# Patient Record
Sex: Male | Born: 1995
Health system: Southern US, Community
[De-identification: ages and names within clinical notes are randomized; demographics above are authoritative.]

## PROBLEM LIST (undated history)

## (undated) DIAGNOSIS — F419 Anxiety disorder, unspecified: Secondary | ICD-10-CM

## (undated) DIAGNOSIS — J45909 Unspecified asthma, uncomplicated: Secondary | ICD-10-CM

## (undated) DIAGNOSIS — D573 Sickle-cell trait: Secondary | ICD-10-CM

---
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
## 2016-06-04 ENCOUNTER — Ambulatory Visit (INDEPENDENT_AMBULATORY_CARE_PROVIDER_SITE_OTHER): Payer: 59 | Admitting: Physician Assistant

## 2016-06-04 ENCOUNTER — Encounter: Payer: Self-pay | Admitting: Physician Assistant

## 2016-06-04 VITALS — BP 122/72 | HR 76 | Temp 98.6°F | Ht 69.0 in | Wt 205.0 lb

## 2016-06-04 DIAGNOSIS — S01111A Laceration without foreign body of right eyelid and periocular area, initial encounter: Secondary | ICD-10-CM

## 2016-06-04 NOTE — Patient Instructions (Addendum)
WOUND CARE Please return in 7 days to have your stitches/staples removed or sooner if you have concerns. . Keep area clean and dry for 24 hours. Do not remove bandage, if applied. . After 24 hours, remove bandage and wash wound gently with mild soap and warm water. Reapply a new bandage after cleaning wound, if directed. . Continue daily cleansing with soap and water until stitches/staples are removed. . Do not apply any ointments or creams to the wound while stitches/staples are in place, as this may cause delayed healing. . Notify the office if you experience any of the following signs of infection: Swelling, redness, pus drainage, streaking, fever >101.0 F . Notify the office if you experience excessive bleeding that does not stop after 15-20 minutes of constant, firm pressure.      IF you received an x-ray today, you will receive an invoice from [facility]. Please contact [facility] at [phone] with questions or concerns regarding your invoice.   IF you received labwork today, you will receive an invoice from [facility]. Please contact [facility] at [phone] with questions or concerns regarding your invoice.   Our billing staff will not be able to assist you with questions regarding bills from these companies.  You will be contacted with the lab results as soon as they are available. The fastest way to get your results is to activate your My Chart account. Instructions are located on the last page of this paperwork. If you have not heard from us regarding the results in 2 weeks, please contact this office.      
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
## 2016-06-11 ENCOUNTER — Ambulatory Visit (INDEPENDENT_AMBULATORY_CARE_PROVIDER_SITE_OTHER): Payer: 59 | Admitting: Physician Assistant

## 2016-06-11 VITALS — BP 110/70 | HR 83 | Temp 98.3°F | Resp 18 | Ht 69.0 in | Wt 205.6 lb

## 2016-06-11 DIAGNOSIS — S01111D Laceration without foreign body of right eyelid and periocular area, subsequent encounter: Secondary | ICD-10-CM

## 2016-06-11 NOTE — Patient Instructions (Addendum)
Thank you for letting me participate in your health and well being.      IF you received an x-ray today, you will receive an invoice from [facility]. Please contact [facility] at [phone] with questions or concerns regarding your invoice.   IF you received labwork today, you will receive an invoice from [facility]. Please contact [facility] at [phone] with questions or concerns regarding your invoice.   Our billing staff will not be able to assist you with questions regarding bills from these companies.  You will be contacted with the lab results as soon as they are available. The fastest way to get your results is to activate your My Chart account. Instructions are located on the last page of this paperwork. If you have not heard from us regarding the results in 2 weeks, please contact this office.      

## 2016-06-11 NOTE — Progress Notes (Signed)
   Patient: Gerald Richmond [id number]  Subjective: Gerald Richmond is returning for suture removal. Patient was initially seen on 06/04/16 and had 5 simple interrupted sutures placed. Denies fever, drainage of pus or blood, wound dehiscence, edema, pain.   Objective: Physical Exam  Constitutional: He is oriented to person, place, and time and well-developed, well-nourished, and in no distress.  HENT:  Head: Normocephalic and atraumatic.  Eyes: Conjunctivae are normal.    Neck: Normal range of motion.  Pulmonary/Chest: Effort normal.  Neurological: He is alert and oriented to person, place, and time. Gait normal.  Skin: Skin is warm and dry.  Psychiatric: Affect normal.  Vitals reviewed.  #5 sutures removed without incident. Patient tolerated this well.  Assessment and Plan: 1. Laceration of right eyebrow, subsequent encounter Well-healed wound. Anticipatory guidance provided. Return to clinic as needed.  Benjiman CoreBrittany Jeneal Vogl, PA-C  [facility] 06/11/2016 8:44 AM

## 2016-08-19 ENCOUNTER — Ambulatory Visit (INDEPENDENT_AMBULATORY_CARE_PROVIDER_SITE_OTHER): Payer: 59 | Admitting: Urgent Care

## 2016-08-19 VITALS — BP 136/74 | HR 80 | Temp 98.2°F

## 2016-08-19 DIAGNOSIS — S0101XA Laceration without foreign body of scalp, initial encounter: Secondary | ICD-10-CM
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
## 2016-08-19 NOTE — Patient Instructions (Signed)
Laceration Care, Adult Introduction A laceration is a cut that goes through all layers of the skin. The cut also goes into the tissue that is right under the skin. Some cuts heal on their own. Others need to be closed with stitches (sutures), staples, skin adhesive strips, or wound glue. Taking care of your cut lowers your risk of infection and helps your cut to heal better. How to take care of your cut For stitches or staples:  Keep the wound clean and dry.  If you were given a bandage (dressing), you should change it at least one time per day or as told by your doctor. You should also change it if it gets wet or dirty.  Keep the wound completely dry for the first 24 hours or as told by your doctor. After that time, you may take a shower or a bath. However, make sure that the wound is not soaked in water until after the stitches or staples have been removed.  Clean the wound one time each day or as told by your doctor:  Wash the wound with soap and water.  Rinse the wound with water until all of the soap comes off.  Pat the wound dry with a clean towel. Do not rub the wound.  After you clean the wound, put a thin layer of antibiotic ointment on it as told by your doctor. This ointment:  Helps to prevent infection.  Keeps the bandage from sticking to the wound.  Have your stitches or staples removed as told by your doctor. If your doctor used skin adhesive strips:  Keep the wound clean and dry.  If you were given a bandage, you should change it at least one time per day or as told by your doctor. You should also change it if it gets dirty or wet.  Do not get the skin adhesive strips wet. You can take a shower or a bath, but be careful to keep the wound dry.  If the wound gets wet, pat it dry with a clean towel. Do not rub the wound.  Skin adhesive strips fall off on their own. You can trim the strips as the wound heals. Do not remove any strips that are still stuck to the wound.  They will fall off after a while. If your doctor used wound glue:  Try to keep your wound dry, but you may briefly wet it in the shower or bath. Do not soak the wound in water, such as by swimming.  After you take a shower or a bath, gently pat the wound dry with a clean towel. Do not rub the wound.  Do not do any activities that will make you really sweaty until the skin glue has fallen off on its own.  Do not apply liquid, cream, or ointment medicine to your wound while the skin glue is still on.  If you were given a bandage, you should change it at least one time per day or as told by your doctor. You should also change it if it gets dirty or wet.  If a bandage is placed over the wound, do not let the tape for the bandage touch the skin glue.  Do not pick at the glue. The skin glue usually stays on for 5-10 days. Then, it falls off of the skin. General Instructions  To help prevent scarring, make sure to cover your wound with sunscreen whenever you are outside after stitches are removed, after adhesive strips are removed,   or when wound glue stays in place and the wound is healed. Make sure to wear a sunscreen of at least 30 SPF.  Take over-the-counter and prescription medicines only as told by your doctor.  If you were given antibiotic medicine or ointment, take or apply it as told by your doctor. Do not stop using the antibiotic even if your wound is getting better.  Do not scratch or pick at the wound.  Keep all follow-up visits as told by your doctor. This is important.  Check your wound every day for signs of infection. Watch for:  Redness, swelling, or pain.  Fluid, blood, or pus.  Raise (elevate) the injured area above the level of your heart while you are sitting or lying down, if possible. Get help if:  You got a tetanus shot and you have any of these problems at the injection site:  Swelling.  Very bad pain.  Redness.  Bleeding.  You have a fever.  A wound  that was closed breaks open.  You notice a bad smell coming from your wound or your bandage.  You notice something coming out of the wound, such as wood or glass.  Medicine does not help your pain.  You have more redness, swelling, or pain at the site of your wound.  You have fluid, blood, or pus coming from your wound.  You notice a change in the color of your skin near your wound.  You need to change the bandage often because fluid, blood, or pus is coming from the wound.  You start to have a new rash.  You start to have numbness around the wound. Get help right away if:  You have very bad swelling around the wound.  Your pain suddenly gets worse and is very bad.  You notice painful lumps near the wound or on skin that is anywhere on your body.  You have a red streak going away from your wound.  The wound is on your hand or foot and you cannot move a finger or toe like you usually can.  The wound is on your hand or foot and you notice that your fingers or toes look pale or bluish. This information is not intended to replace advice given to you by your health care provider. Make sure you discuss any questions you have with your health care provider. Document Released: 01/20/2008 Document Revised: 01/09/2016 Document Reviewed: 07/30/2014  2017 [employer]vier  

## 2016-08-26 ENCOUNTER — Ambulatory Visit: Payer: Self-pay

## 2017-10-07 ENCOUNTER — Encounter: Payer: Self-pay | Admitting: Physician Assistant

## 2017-10-07 ENCOUNTER — Telehealth: Payer: Self-pay | Admitting: Physician Assistant

## 2017-10-07 ENCOUNTER — Ambulatory Visit (INDEPENDENT_AMBULATORY_CARE_PROVIDER_SITE_OTHER): Payer: No Typology Code available for payment source

## 2017-10-07 ENCOUNTER — Other Ambulatory Visit: Payer: Self-pay

## 2017-10-07 ENCOUNTER — Ambulatory Visit (INDEPENDENT_AMBULATORY_CARE_PROVIDER_SITE_OTHER): Payer: No Typology Code available for payment source | Admitting: Physician Assistant

## 2017-10-07 VITALS — BP 130/62 | HR 72 | Temp 98.3°F | Resp 16 | Ht 69.0 in | Wt 200.8 lb

## 2017-10-07 DIAGNOSIS — S62614A Displaced fracture of proximal phalanx of right ring finger, initial encounter for closed fracture: Secondary | ICD-10-CM | POA: Diagnosis not present

## 2017-10-07 DIAGNOSIS — M79641 Pain in right hand: Secondary | ICD-10-CM
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
## 2017-10-07 NOTE — Telephone Encounter (Signed)
Incoming call from [facility]. They are calling to notify provider patient has an appointment tomorrow at 1:45 with Dr. Amanda PeaGramig. Provider, Lorain ChildesFYI.

## 2017-10-07 NOTE — Patient Instructions (Addendum)
Please stay in the splint at this time.  I am referring you to ortho.  Please await this call.  You can take tylenol for the pain.    Finger Fracture A finger fracture is a break in any of the bones in your fingers. Usually, a broken finger is caused by an injury. This includes:  Getting hurt while playing sports.  Getting hurt at work.  Falling.  Your doctor may put a splint on your finger so it will not move while it heals (immobilization). Follow these instructions at home: If you have a splint  Wear the splint as told by your doctor. Take it off only as told by your doctor.  Loosen the splint if your fingers tingle, get numb, or turn cold and blue.  Keep the splint clean.  If the splint is not waterproof: ? Do not let it get wet. ? Cover it with a watertight covering when you take a bath or a shower. Managing pain, stiffness, and swelling  If directed, put ice on the injured area: ? If you have a removable splint, take it off as told by your doctor. ? Put ice in a plastic bag. ? Place a towel between your skin and the bag. ? Leave the ice on for 20 minutes, 2-3 times a day.  Move your fingers often to help with stiffness and swelling.  Raise (elevate) the injured area above the level of your heart while you are sitting or lying down. Driving  Do not drive or use heavy machinery while taking prescription pain medicine.  Ask your doctor when it is safe to drive if you have a splint. General instructions  Do not put pressure on any part of the splint until it is fully hardened. This may take several hours.  Do not use any products that contain nicotine or tobacco, such as cigarettes and e-cigarettes. These can delay bone healing. If you need help quitting, ask your doctor.  Take over-the-counter and prescription medicines only as told by your doctor.  Do exercises as told by your doctor.  Keep all follow-up visits as told by your doctor. This is  important. Contact a doctor if:  Your pain or swelling gets worse.  You have trouble moving your finger. Get help right away if:  Your finger gets numb or turns blue. Summary  A finger fracture is a break in any of the bones in your fingers.  Usually, a broken finger is caused by an injury. This may be from getting hurt in sports or at work or from falling.  You may need to wear a splint on your finger so it will not move while it heals (immobilization). This information is not intended to replace advice given to you by your health care provider. Make sure you discuss any questions you have with your health care provider. Document Released: 01/20/2008 Document Revised: 06/23/2016 Document Reviewed: 06/23/2016 Elsevier Interactive Patient Education  2017 [employer]   IF you received an x-ray today, you will receive an invoice from [facility]. Please contact [facility] at [phone] with questions or concerns regarding your invoice.   IF you received labwork today, you will receive an invoice from [facility]. Please contact [facility] at [phone] with questions or concerns regarding your invoice.   Our billing staff will not be able to assist you with questions regarding bills from these companies.  You will be contacted with the lab results as soon as they are available. The fastest way  to get your results is to activate your My Chart account. Instructions are located on the last page of this paperwork. If you have not heard from [place] regarding the results in 2 weeks, please contact this office.

## 2017-10-15 ENCOUNTER — Ambulatory Visit: Payer: Self-pay | Admitting: Family Medicine

## 2017-11-24 ENCOUNTER — Encounter: Payer: Self-pay | Admitting: Physician Assistant

## 2017-11-29 ENCOUNTER — Encounter: Payer: No Typology Code available for payment source | Admitting: Physician Assistant

## 2018-04-18 ENCOUNTER — Ambulatory Visit (INDEPENDENT_AMBULATORY_CARE_PROVIDER_SITE_OTHER): Payer: Self-pay | Admitting: Nurse Practitioner

## 2018-04-18 ENCOUNTER — Encounter: Payer: Self-pay | Admitting: Nurse Practitioner

## 2018-04-18 VITALS — BP 118/76 | HR 82 | Temp 99.5°F | Resp 16 | Ht 69.0 in | Wt 198.6 lb

## 2018-04-18 DIAGNOSIS — B354 Tinea corporis: Secondary | ICD-10-CM

## 2018-04-18 MED ORDER — KETOCONAZOLE 2 % EX CREA: 1.0000 "application " | TOPICAL_CREAM | Freq: Two times a day (BID) | CUTANEOUS | 0 refills | Status: AC

## 2018-04-18 NOTE — Patient Instructions (Signed)
Body Ringworm -Apply to the affected area twice daily until symptoms improve. -Keep area clean and dry.  Keep area covered to prevent spreading. -Do not scratch.  Strict handwashing as this is highly contagious. -Follow-up as needed.   Body ringworm is an infection of the skin that often causes a ring-shaped rash. Body ringworm can affect any part of your skin. It can spread easily to others. Body ringworm is also called tinea corporis. What are the causes? This condition is caused by funguses called dermatophytes. The condition develops when these funguses grow out of control on the skin. You can get this condition if you touch a person or animal that has it. You can also get it if you share clothing, bedding, towels, or any other object with an infected person or pet. What increases the risk? This condition is more likely to develop in:  Athletes who often make skin-to-skin contact with other athletes, such as wrestlers.  People who share equipment and mats.  People with a weakened immune system.  What are the signs or symptoms? Symptoms of this condition include:  Itchy, raised red spots and bumps.  Red scaly patches.  A ring-shaped rash. The rash may have: ? A clear center. ? Scales or red bumps at its center. ? Redness near its borders. ? Dry and scaly skin on or around it.  How is this diagnosed? This condition can usually be diagnosed with a skin exam. A skin scraping may be taken from the affected area and examined under a microscope to see if the fungus is present. How is this treated? This condition may be treated with:  An antifungal cream or ointment.  An antifungal shampoo.  Antifungal medicines. These may be prescribed if your ringworm is severe, keeps coming back, or lasts a long time.  Follow these instructions at home:  Take over-the-counter and prescription medicines only as told by your health care provider.  If you were given an antifungal cream or  ointment: ? Use it as told by your health care provider. ? Wash the infected area and dry it completely before applying the cream or ointment.  If you were given an antifungal shampoo: ? Use it as told by your health care provider. ? Leave the shampoo on your body for 3-5 minutes before rinsing.  While you have a rash: ? Wear loose clothing to stop clothes from rubbing and irritating it. ? Wash or change your bed sheets every night.  If your pet has the same infection, take your pet to see a International aid/development worker. How is this prevented?  Practice good hygiene.  Wear sandals or shoes in public places and showers.  Do not share personal items with others.  Avoid touching red patches of skin on other people.  Avoid touching pets that have bald spots.  If you touch an animal that has a bald spot, wash your hands. Contact a health care provider if:  Your rash continues to spread after 7 days of treatment.  Your rash is not gone in 4 weeks.  The area around your rash gets red, warm, tender, and swollen. This information is not intended to replace advice given to you by your health care provider. Make sure you discuss any questions you have with your health care provider. Document Released: 07/31/2000 Document Revised: 01/09/2016 Document Reviewed: 05/30/2015 Elsevier Interactive Patient Education  [employer].

## 2018-04-18 NOTE — Progress Notes (Signed)
Subjective:     Gerald Richmond is a 22 y.o. male who presents for evaluation of a rash involving the right ankle. Rash was noted  5 days ago. Lesions are red, and raised in texture. Rash has changed over time. Rash is pruritic. Associated symptoms: none. Patient denies: fever, headache, nausea and vomiting. Patient has not had contacts with similar rash. Patient has not had new exposures (soaps, lotions, laundry detergents, foods, medications, plants, insects or animals).  The following portions of the patient's history were reviewed and updated as appropriate: allergies, current medications and past medical history.  Review of Systems Constitutional: negative Ears, nose, mouth, throat, and face: negative Respiratory: negative Cardiovascular: negative Integument/breast: positive for pruritus, rash and skin color change, negative for dryness and discharge    Objective:    BP 118/76 (BP Location: Right Arm, Patient Position: Sitting, Cuff Size: Normal)   Pulse 82   Temp 99.5 F (37.5 C) (Oral)   Resp 16   Ht 5\' 9"  (1.753 m)   Wt 198 lb 9.6 oz (90.1 kg)   SpO2 98%   BMI 29.33 kg/m  General:  alert, cooperative and no distress  Skin:  papules noted on inner right ankle, erythematous with raised borders, central clearing, annular and targetoid, measures appoximately 1.5cm in diameter     Assessment:   Tinea Corporis   Plan:   Exam findings, diagnosis etiology and medication use and indications reviewed with patient. Follow- Up and discharge instructions provided. No emergent/urgent issues found on exam.  Patient education was provided.  Patient verbalized understanding of information provided and agrees with plan of care (POC), all questions answered. The patient is advised to call or return to clinic if he does not see an improvement in symptoms, or to seek the care of the closest emergency department if he worsens with the above plan.   1. Tinea corporis  - ketoconazole  (NIZORAL) 2 % cream; Apply 1 application topically 2 (two) times daily for 14 days. Apply twice daily until symptoms improve.  Dispense: 30 g; Refill: 0 -Apply to the affected area twice daily until symptoms improve. -Keep area clean and dry.  Keep area covered to prevent spreading. -Do not scratch.  Strict handwashing as this is highly contagious. -Follow-up as needed.

## 2018-08-22 ENCOUNTER — Encounter: Payer: Self-pay | Admitting: Nurse Practitioner

## 2018-08-22 ENCOUNTER — Ambulatory Visit (INDEPENDENT_AMBULATORY_CARE_PROVIDER_SITE_OTHER): Payer: Self-pay | Admitting: Nurse Practitioner

## 2018-08-22 VITALS — BP 130/62 | HR 106 | Temp 101.1°F | Wt 203.2 lb

## 2018-08-22 DIAGNOSIS — R509 Fever, unspecified: Secondary | ICD-10-CM

## 2018-08-22 DIAGNOSIS — J111 Influenza due to unidentified influenza virus with other respiratory manifestations: Secondary | ICD-10-CM

## 2018-08-22 LAB — POCT INFLUENZA A/B
Influenza A, POC: NEGATIVE
Influenza B, POC: NEGATIVE

## 2018-08-22 MED ORDER — PSEUDOEPH-BROMPHEN-DM 30-2-10 MG/5ML PO SYRP: 5.0000 mL | ORAL_SOLUTION | Freq: Four times a day (QID) | ORAL | 0 refills | Status: AC | PRN

## 2018-08-22 MED ORDER — OSELTAMIVIR PHOSPHATE 75 MG PO CAPS: 75.0000 mg | ORAL_CAPSULE | Freq: Two times a day (BID) | ORAL | 0 refills | Status: AC

## 2018-08-22 MED ORDER — ONDANSETRON HCL 4 MG PO TABS: 4.0000 mg | ORAL_TABLET | Freq: Three times a day (TID) | ORAL | 0 refills | Status: AC | PRN

## 2018-08-22 MED ORDER — LIDOCAINE VISCOUS HCL 2 % MT SOLN: 5.0000 mL | Freq: Four times a day (QID) | OROMUCOSAL | 0 refills | Status: AC | PRN

## 2018-08-22 MED FILL — OSELTAMIVIR PHOSPHATE 75 MG: 75 | 5 days supply | Qty: 10 | Fill #0

## 2018-08-22 MED FILL — LIDOCAINE 2% VISCOUS SOLN: 2 | 5 days supply | Qty: 100 | Fill #0

## 2018-08-22 MED FILL — BROMPHENIR-PSEUDOEPHED-DM S: 30-2-10 | 7 days supply | Qty: 150 | Fill #0

## 2018-08-22 MED FILL — ONDANSETRON HCL 4 MG TABLET: 4 | 3 days supply | Qty: 10 | Fill #0

## 2018-08-22 NOTE — Patient Instructions (Signed)
Influenza, Adult -Take medication as prescribed. -Symptomatic treatment is recommended to include Ibuprofen or Tylenol for pain, fever, or general discomfort. -Increase fluids. -Get plenty of rest. -May use a teaspoon of honey or over-the-counter cough drops to help with throat pain -Warm saltwater gargles 3-4 times daily for throat pain. -Remain home until fever free for 24 hours. -Follow-up if symptoms do not improve. -Work note provided.  Influenza, more commonly known as "the flu," is a viral infection that mainly affects the respiratory tract. The respiratory tract includes organs that help you breathe, such as the lungs, nose, and throat. The flu causes many symptoms similar to the common cold along with high fever and body aches. The flu spreads easily from person to person (is contagious). Getting a flu shot (influenza vaccination) every year is the best way to prevent the flu. What are the causes? This condition is caused by the influenza virus. You can get the virus by:  Breathing in droplets that are in the air from an infected person's cough or sneeze.  Touching something that has been exposed to the virus (has been contaminated) and then touching your mouth, nose, or eyes. What increases the risk? The following factors may make you more likely to get the flu:  Not washing or sanitizing your hands often.  Having close contact with many people during cold and flu season.  Touching your mouth, eyes, or nose without first washing or sanitizing your hands.  Not getting a yearly (annual) flu shot. You may have a higher risk for the flu, including serious problems such as a lung infection (pneumonia), if you:  Are older than 65.  Are pregnant.  Have a weakened disease-fighting system (immune system). You may have a weakened immune system if you: ? Have HIV or AIDS. ? Are undergoing chemotherapy. ? Are taking medicines that reduce (suppress) the activity of your immune  system.  Have a long-term (chronic) illness, such as heart disease, kidney disease, diabetes, or lung disease.  Have a liver disorder.  Are severely overweight (morbidly obese).  Have anemia. This is a condition that affects your red blood cells.  Have asthma. What are the signs or symptoms? Symptoms of this condition usually begin suddenly and last 4-14 days. They may include:  Fever and chills.  Headaches, body aches, or muscle aches.  Sore throat.  Cough.  Runny or stuffy (congested) nose.  Chest discomfort.  Poor appetite.  Weakness or fatigue.  Dizziness.  Nausea or vomiting. How is this diagnosed? This condition may be diagnosed based on:  Your symptoms and medical history.  A physical exam.  Swabbing your nose or throat and testing the fluid for the influenza virus. How is this treated? If the flu is diagnosed early, you can be treated with medicine that can help reduce how severe the illness is and how long it lasts (antiviral medicine). This may be given by mouth (orally) or through an IV. Taking care of yourself at home can help relieve symptoms. Your health care provider may recommend:  Taking over-the-counter medicines.  Drinking plenty of fluids. In many cases, the flu goes away on its own. If you have severe symptoms or complications, you may be treated in a hospital. Follow these instructions at home: Activity  Rest as needed and get plenty of sleep.  Stay home from work or school as told by your health care provider. Unless you are visiting your health care provider, avoid leaving home until your fever has been gone   for 24 hours without taking medicine. Eating and drinking  Take an oral rehydration solution (ORS). This is a drink that is sold at pharmacies and retail stores.  Drink enough fluid to keep your urine pale yellow.  Drink clear fluids in small amounts as you are able. Clear fluids include water, ice chips, diluted fruit juice,  and low-calorie sports drinks.  Eat bland, easy-to-digest foods in small amounts as you are able. These foods include bananas, applesauce, rice, lean meats, toast, and crackers.  Avoid drinking fluids that contain a lot of sugar or caffeine, such as energy drinks, regular sports drinks, and soda.  Avoid alcohol.  Avoid spicy or fatty foods. General instructions      Take over-the-counter and prescription medicines only as told by your health care provider.  Use a cool mist humidifier to add humidity to the air in your home. This can make it easier to breathe.  Cover your mouth and nose when you cough or sneeze.  Wash your hands with soap and water often, especially after you cough or sneeze. If soap and water are not available, use alcohol-based hand sanitizer.  Keep all follow-up visits as told by your health care provider. This is important. How is this prevented?   Get an annual flu shot. You may get the flu shot in late summer, fall, or winter. Ask your health care provider when you should get your flu shot.  Avoid contact with people who are sick during cold and flu season. This is generally fall and winter. Contact a health care provider if:  You develop new symptoms.  You have: ? Chest pain. ? Diarrhea. ? A fever.  Your cough gets worse.  You produce more mucus.  You feel nauseous or you vomit. Get help right away if:  You develop shortness of breath or difficulty breathing.  Your skin or nails turn a bluish color.  You have severe pain or stiffness in your neck.  You develop a sudden headache or sudden pain in your face or ear.  You cannot eat or drink without vomiting. Summary  Influenza, more commonly known as "the flu," is a viral infection that primarily affects your respiratory tract.  Symptoms of the flu usually begin suddenly and last 4-14 days.  Getting an annual flu shot is the best way to prevent getting the flu.  Stay home from work or  school as told by your health care provider. Unless you are visiting your health care provider, avoid leaving home until your fever has been gone for 24 hours without taking medicine.  Keep all follow-up visits as told by your health care provider. This is important. This information is not intended to replace advice given to you by your health care provider. Make sure you discuss any questions you have with your health care provider. Document Released: 07/31/2000 Document Revised: 01/19/2018 Document Reviewed: 01/19/2018 Elsevier Interactive Patient Education  2019 [employer]

## 2018-08-22 NOTE — Progress Notes (Signed)
Subjective:     Gerald Richmond is a 23 y.o. male who presents for evaluation of influenza like symptoms. Symptoms include suspected fevers but not measured at home, chills, headache, myalgias, pain while swallowing, productive cough, sore throat, swollen glands and fatigue, nausea and vomiting and have been present for 2 days. He has tried to alleviate the symptoms with ibuprofen with minimal relief. High risk factors for influenza complications: none.  Patient informs he has not had an influenza vaccine this flu season.  The following portions of the patient's history were reviewed and updated as appropriate: allergies, current medications and past medical history.  Review of Systems Constitutional: positive for anorexia, chills, fatigue, fevers, malaise and sweats, negative for weight loss Eyes: negative Ears, nose, mouth, throat, and face: positive for nasal congestion and sore throat, negative for ear drainage, earaches and hoarseness Respiratory: positive for cough, negative for asthma, chronic bronchitis, dyspnea on exertion, sputum, stridor and wheezing Cardiovascular: negative Gastrointestinal: positive for nausea and vomiting, negative for abdominal pain, change in bowel habits, constipation and diarrhea Neurological: positive for headaches, negative for coordination problems, dizziness, gait problems, paresthesia, vertigo and weakness     Objective:    BP 130/62 (BP Location: Right Arm, Patient Position: Sitting)   Pulse (!) 106   Temp (!) 101.1 F (38.4 C) (Oral)   Wt 203 lb 3.2 oz (92.2 kg)   SpO2 97%   BMI 30.01 kg/m  General appearance: alert, cooperative, fatigued, no distress and diaphoretic, looks "uncomfortable" Head: Normocephalic, without obvious abnormality, atraumatic Eyes: conjunctivae/corneas clear. PERRL, EOM's intact. Fundi benign. Ears: normal TM's and external ear canals both ears Nose: no discharge, moderate congestion, turbinates swollen, inflamed, no  sinus tenderness Throat: abnormal findings: moderate oropharyngeal erythema and + oropharyngeal edema, tonsils +1 bilaterally, no exudates, uvula midline, no airway compromise Lungs: clear to auscultation bilaterally Heart: S1, S2 normal and tachycardic Abdomen: soft, non-tender; bowel sounds normal; no masses,  no organomegaly Pulses: 2+ and symmetric Skin: Skin color, texture, turgor normal. No rashes or lesions Lymph nodes: bilateral superficial adenopathy Neurologic: Grossly normal    Assessment:    Influenza    Plan:   Exam findings, diagnosis etiology and medication use and indications reviewed with patient. Follow- Up and discharge instructions provided. No emergent/urgent issues found on exam.  Based on the patient's clinical presentation, symptoms, and duration of symptoms despite negative influenza test, will treat patient with Tamiflu.  Instructed patient that we will also provide a prescription for symptomatic treatment for his cough, nausea and throat pain.  Patient will otherwise need to perform symptomatic treatment to include ibuprofen or Tylenol for pain, fever, or general discomfort.  Patient instructed to increase fluids, and get plenty of rest.  Patient instructed to stay at home until fever free for at least 24 hours.  Patient education was provided. Patient verbalized understanding of information provided and agrees with plan of care (POC), all questions answered. The patient is advised to call or return to clinic if condition does not see an improvement in symptoms, or to seek the care of the closest emergency department if condition worsens with the above plan.   1. Fever, unspecified fever cause  - POCT Influenza A/B  2. Influenza  - oseltamivir (TAMIFLU) 75 MG capsule; Take 1 capsule (75 mg total) by mouth 2 (two) times daily for 5 days.  Dispense: 10 capsule; Refill: 0 - ondansetron (ZOFRAN) 4 MG tablet; Take 1 tablet (4 mg total) by mouth every 8 (eight) hours  as  needed for up to 3 days for nausea or vomiting.  Dispense: 10 tablet; Refill: 0 - lidocaine (XYLOCAINE) 2 % solution; Use as directed 5 mLs in the mouth or throat every 6 (six) hours as needed for up to 5 days for mouth pain.  Dispense: 100 mL; Refill: 0 - brompheniramine-pseudoephedrine-DM 30-2-10 MG/5ML syrup; Take 5 mLs by mouth 4 (four) times daily as needed for up to 7 days.  Dispense: 150 mL; Refill: 0 -Take medication as prescribed. -Symptomatic treatment is recommended to include Ibuprofen or Tylenol for pain, fever, or general discomfort. -Increase fluids. -Get plenty of rest. -May use a teaspoon of honey or over-the-counter cough drops to help with throat pain -Warm saltwater gargles 3-4 times daily for throat pain. -Remain home until fever free for 24 hours. -Follow-up if symptoms do not improve.

## 2019-02-22 ENCOUNTER — Ambulatory Visit: Payer: No Typology Code available for payment source | Admitting: Family Medicine

## 2019-02-23 ENCOUNTER — Other Ambulatory Visit: Payer: Self-pay

## 2019-02-23 ENCOUNTER — Ambulatory Visit (INDEPENDENT_AMBULATORY_CARE_PROVIDER_SITE_OTHER): Payer: No Typology Code available for payment source | Admitting: Family Medicine

## 2019-02-23 ENCOUNTER — Encounter: Payer: Self-pay | Admitting: Family Medicine

## 2019-02-23 VITALS — BP 136/74 | HR 76 | Temp 97.7°F | Ht 69.0 in | Wt 205.0 lb

## 2019-02-23 DIAGNOSIS — R1084 Generalized abdominal pain: Secondary | ICD-10-CM | POA: Diagnosis not present

## 2019-02-23 NOTE — Patient Instructions (Signed)
° ° ° °  If you have lab work done today you will be contacted with your lab results within the next 2 weeks.  If you have not heard from us then please contact us. The fastest way to get your results is to register for My Chart. ° ° °IF you received an x-ray today, you will receive an invoice from [facility]. Please contact [facility] at [phone] with questions or concerns regarding your invoice.  ° °IF you received labwork today, you will receive an invoice from [facility]. Please contact [facility] at [phone] with questions or concerns regarding your invoice.  ° °Our billing staff will not be able to assist you with questions regarding bills from these companies. ° °You will be contacted with the lab results as soon as they are available. The fastest way to get your results is to activate your My Chart account. Instructions are located on the last page of this paperwork. If you have not heard from us regarding the results in 2 weeks, please contact this office. °  ° ° ° °
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
## 2019-04-18 ENCOUNTER — Ambulatory Visit
Admission: EM | Admit: 2019-04-18 | Discharge: 2019-04-18 | Disposition: A | Discharge status: Home / Self Care | Payer: No Typology Code available for payment source | Attending: Physician Assistant | Admitting: Physician Assistant

## 2019-04-18 ENCOUNTER — Other Ambulatory Visit: Payer: Self-pay

## 2019-04-18 DIAGNOSIS — S50861A Insect bite (nonvenomous) of right forearm, initial encounter: Secondary | ICD-10-CM

## 2019-04-18 DIAGNOSIS — T148XXA Other injury of unspecified body region, initial encounter: Secondary | ICD-10-CM

## 2019-04-18 DIAGNOSIS — S50862A Insect bite (nonvenomous) of left forearm, initial encounter: Secondary | ICD-10-CM

## 2019-04-18 DIAGNOSIS — S80861A Insect bite (nonvenomous), right lower leg, initial encounter: Secondary | ICD-10-CM

## 2019-04-18 DIAGNOSIS — S80862A Insect bite (nonvenomous), left lower leg, initial encounter: Secondary | ICD-10-CM | POA: Diagnosis not present

## 2019-04-18 DIAGNOSIS — W57XXXA Bitten or stung by nonvenomous insect and other nonvenomous arthropods, initial encounter: Secondary | ICD-10-CM

## 2019-04-18 MED ORDER — CETIRIZINE HCL 10 MG PO TABS: 10.0000 mg | ORAL_TABLET | Freq: Every day | ORAL | 0 refills | Status: AC

## 2019-04-18 MED ORDER — PREDNISONE 50 MG PO TABS: 50.0000 mg | ORAL_TABLET | Freq: Every day | ORAL | 0 refills | Status: AC

## 2019-04-18 MED ORDER — SARNA 0.5-0.5 % EX LOTN
1.0000 | TOPICAL_LOTION | CUTANEOUS | 0 refills | Status: AC | PRN
Start: 2019-04-18 — End: ?
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
## 2019-04-18 NOTE — Discharge Instructions (Signed)
Start prednisone as directed. Zyrtec daily to help with itching. You can use SARNA lotion to help with itching as well. Try to refrain from scratching. Monitor for spreading redness, increased warmth, pain follow up for reevaluation needed.

## 2019-04-18 NOTE — ED Triage Notes (Signed)
Pt presents to UC w/ c/o bug bites on bilateral legs up to mid thigh, on his entire back, and a few bites on his arms. Pt reports going fishing 2 days ago and saw a small brown bug crawling on him. Pt reports going to sleep after fishing and waking up with the bites. Pt reports they itch and has tried "anti-itch" cream and rubbing alcohol with little relief.

## 2019-04-19 ENCOUNTER — Telehealth: Payer: Self-pay | Admitting: Emergency Medicine

## 2019-09-17 IMAGING — DX DG HAND COMPLETE 3+V*R*
3 series · 3 of 3 positions shown · non-contrast
Comparison: Right hand x-rays dated October 10, 2013.

CLINICAL DATA: Right hand pain, most prominent over the fourth
metacarpal.

EXAM:
RIGHT HAND - COMPLETE 3+ VIEW

[hand pa]
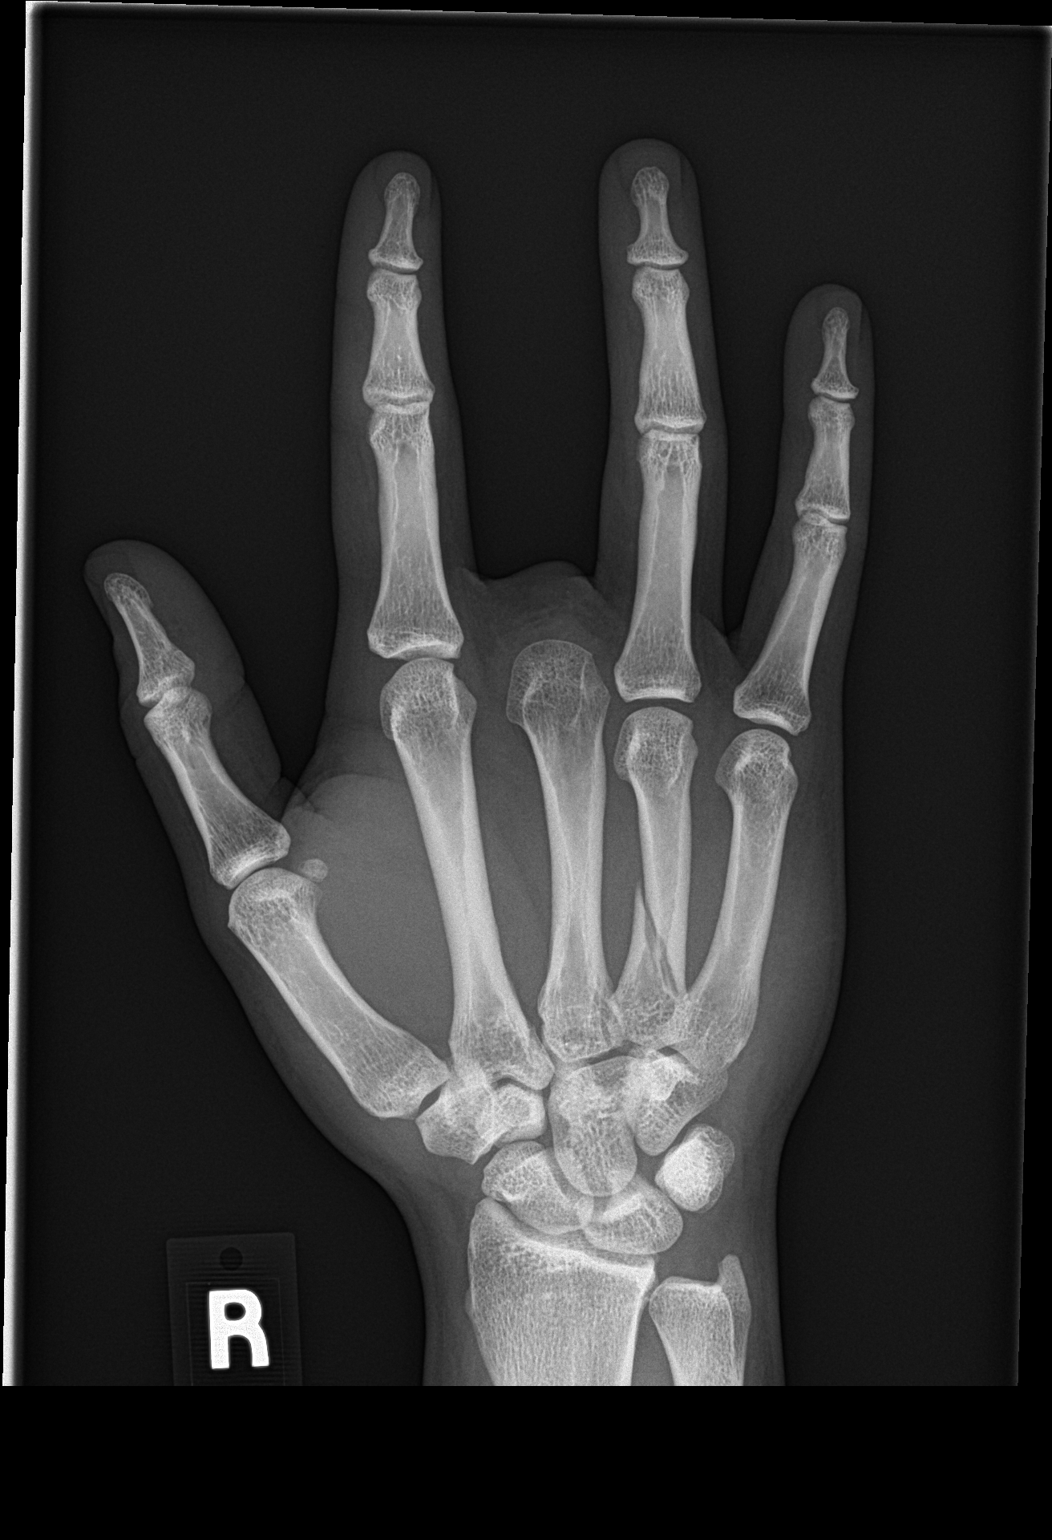

[hand obl]
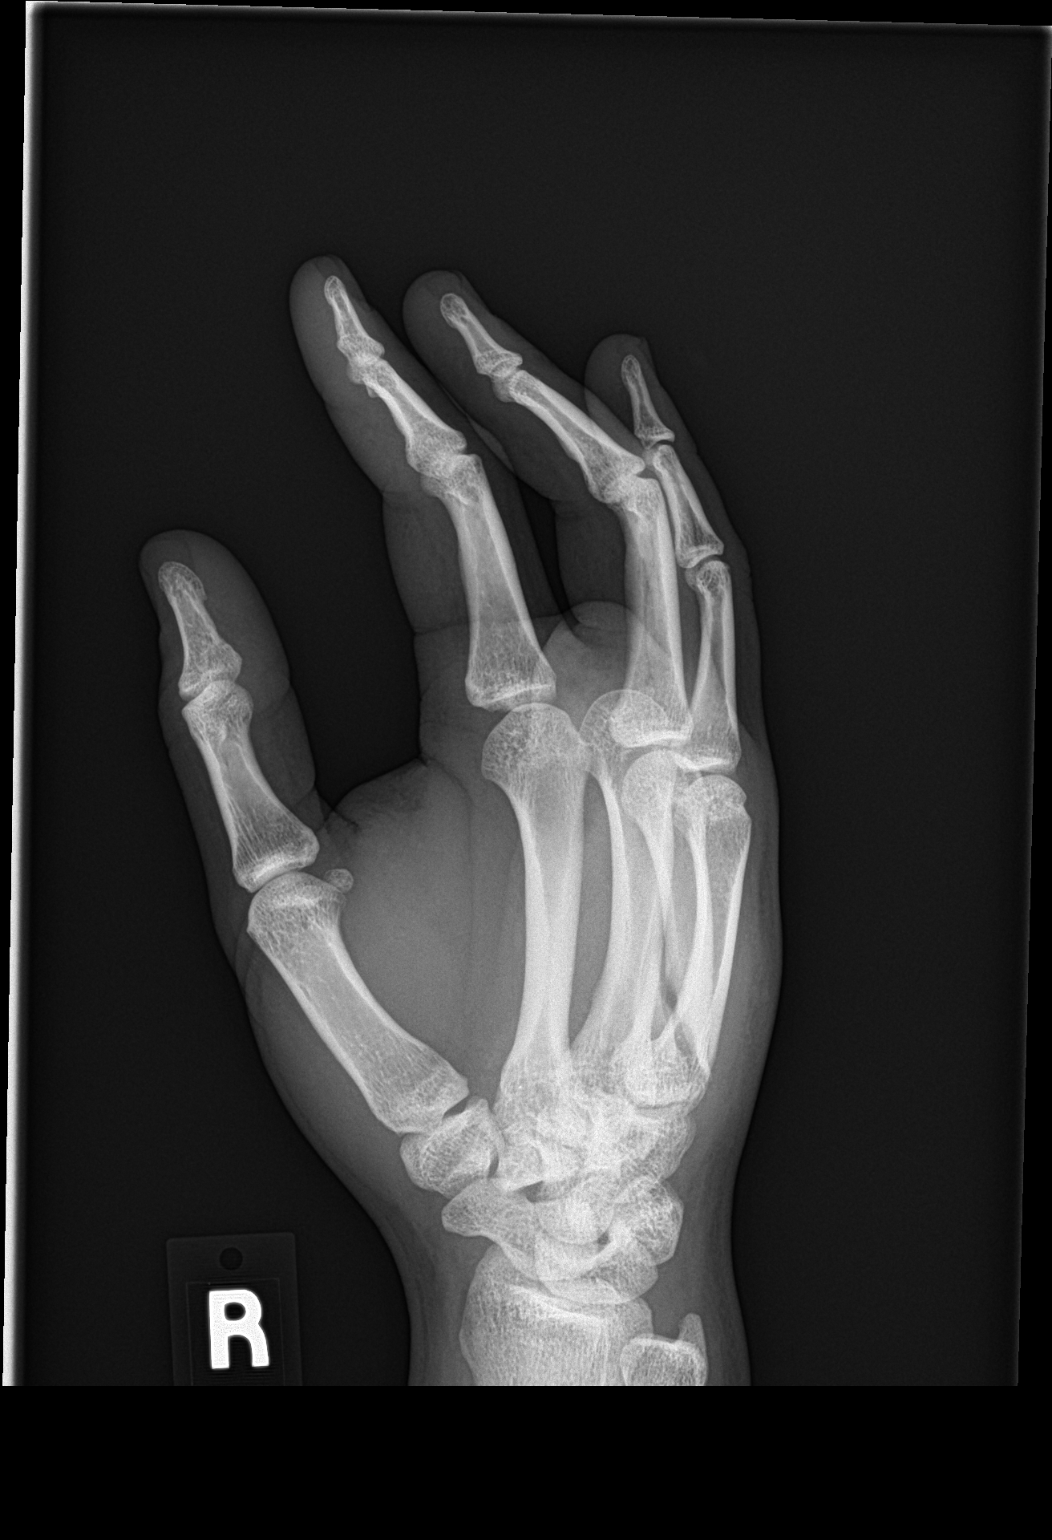

[hand lat]
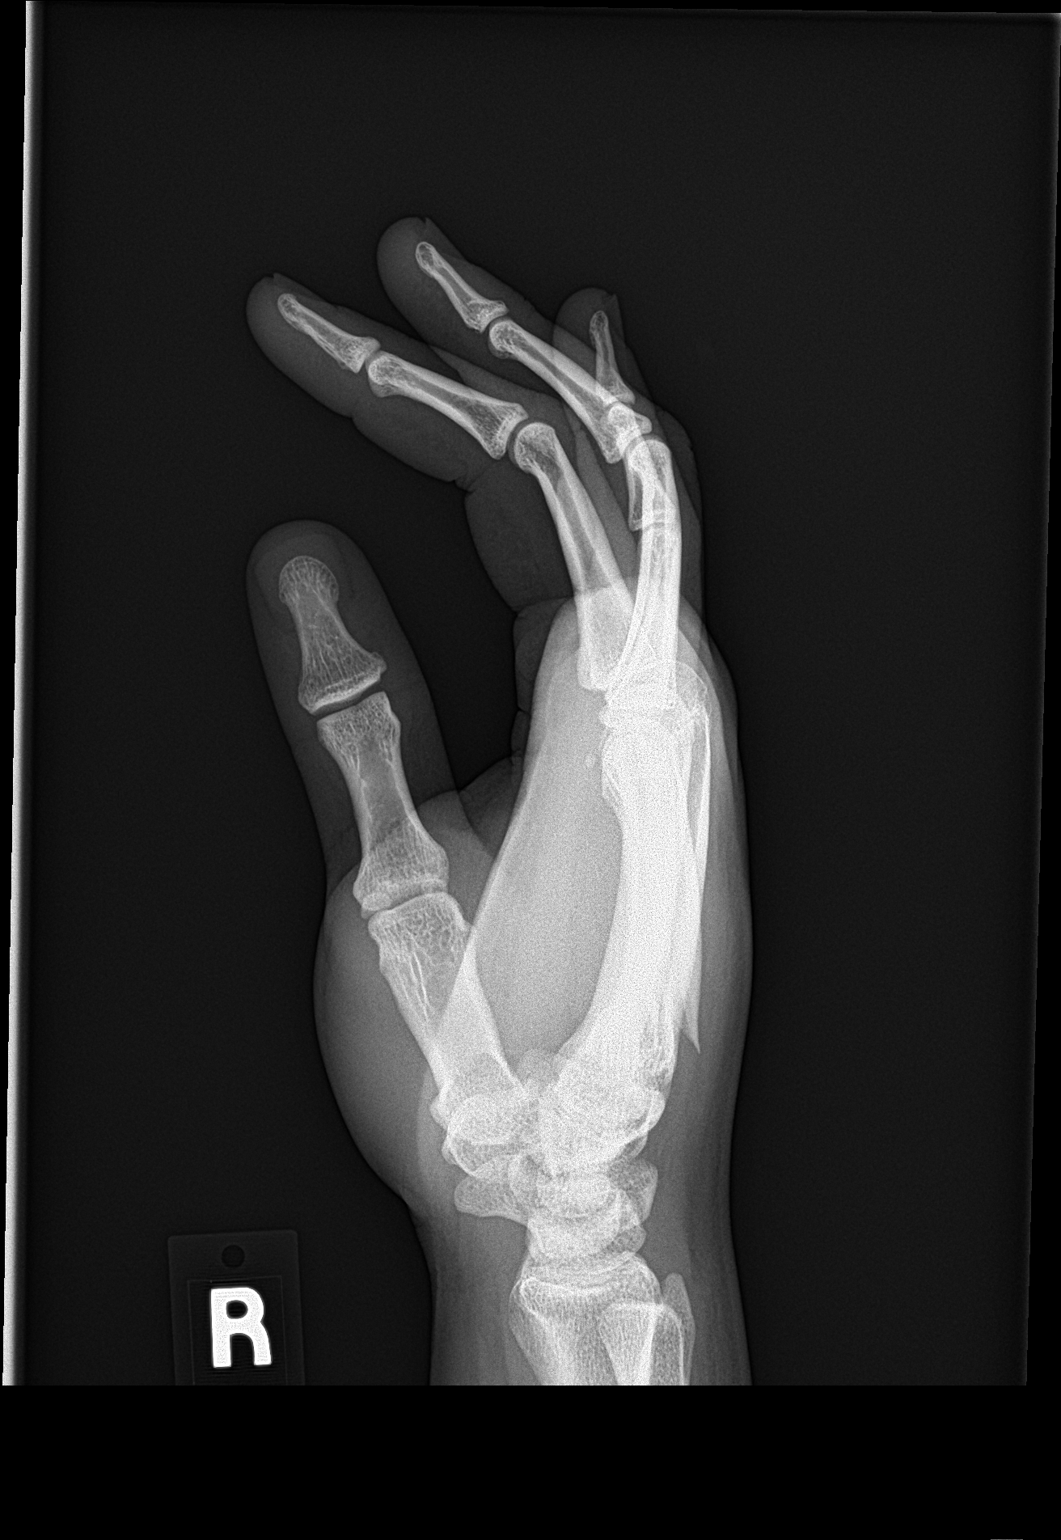

[3 of 3 positions shown; findings below may reference images not displayed]

FINDINGS: There is a minimally displaced, oblique fracture through the
proximal fourth metacarpal diaphysis. There is 1 mm ulnar
displacement and 4 mm dorsal displacement with slight volar
angulation. Prior amputation of the middle finger. Joint spaces are
preserved. Bone mineralization is normal. Soft tissue swelling over
the dorsal hand.
IMPRESSION: 1. Minimally displaced, oblique fracture of the proximal fourth
metacarpal. No clear intra-articular extension into the
carpometacarpal joint.

## 2022-03-18 ENCOUNTER — Other Ambulatory Visit: Payer: Self-pay

## 2022-03-18 ENCOUNTER — Emergency Department
Admission: EM | Admit: 2022-03-18 | Discharge: 2022-03-18 | Disposition: A | Discharge status: Home / Self Care | Payer: Self-pay | Attending: Emergency Medicine | Admitting: Emergency Medicine

## 2022-03-18 ENCOUNTER — Emergency Department: Payer: Self-pay

## 2022-03-18 DIAGNOSIS — S6991XA Unspecified injury of right wrist, hand and finger(s), initial encounter: Secondary | ICD-10-CM | POA: Diagnosis present

## 2022-03-18 DIAGNOSIS — S60221A Contusion of right hand, initial encounter: Secondary | ICD-10-CM | POA: Diagnosis not present

## 2022-03-18 DIAGNOSIS — Y92410 Unspecified street and highway as the place of occurrence of the external cause: Secondary | ICD-10-CM | POA: Diagnosis not present

## 2022-03-18 MED ORDER — MELOXICAM 15 MG PO TABS: 15.0000 mg | ORAL_TABLET | Freq: Every day | ORAL | 0 refills | Status: AC

## 2022-03-18 NOTE — Discharge Instructions (Signed)
Please take Mobic daily as prescribed as needed for pain.  Return to the ER for any worsening symptoms or any urgent changes in your health.  Do not take ibuprofen or Aleve while on meloxicam/Mobic
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
## 2024-03-03 ENCOUNTER — Encounter: Payer: Self-pay | Admitting: Advanced Practice Midwife
